# Patient Record
Sex: Female | Born: 2005
Health system: Southern US, Community
[De-identification: ages and names within clinical notes are randomized; demographics above are authoritative.]

## PROBLEM LIST (undated history)

## (undated) DIAGNOSIS — R002 Palpitations: Secondary | ICD-10-CM

## (undated) DIAGNOSIS — G43909 Migraine, unspecified, not intractable, without status migrainosus: Secondary | ICD-10-CM

## (undated) DIAGNOSIS — J45909 Unspecified asthma, uncomplicated: Secondary | ICD-10-CM

## (undated) HISTORY — DX: Migraine, unspecified, not intractable, without status migrainosus: G43.909

## (undated) HISTORY — DX: Unspecified asthma, uncomplicated: J45.909

## (undated) HISTORY — DX: Palpitations: R00.2

---
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
## 2023-05-03 ENCOUNTER — Telehealth: Payer: Self-pay | Admitting: Neurology

## 2023-05-03 NOTE — Telephone Encounter (Signed)
 Copied from CRM [id number]. Topic: Clinical - Request for Lab/Test Order >> May 03, 2023  3:41 PM Aletta Edouard wrote: Reason for CRM: patient is requesting a tb test

## 2023-05-04 NOTE — Telephone Encounter (Signed)
 Pt needs nurse visit please

## 2023-05-05 ENCOUNTER — Other Ambulatory Visit: Payer: Self-pay

## 2023-05-05 DIAGNOSIS — Z111 Encounter for screening for respiratory tuberculosis: Secondary | ICD-10-CM

## 2023-05-07 ENCOUNTER — Other Ambulatory Visit (INDEPENDENT_AMBULATORY_CARE_PROVIDER_SITE_OTHER)

## 2023-05-07 DIAGNOSIS — Z111 Encounter for screening for respiratory tuberculosis: Secondary | ICD-10-CM

## 2023-05-11 LAB — QUANTIFERON-TB GOLD PLUS
Mitogen-NIL: 8.32 [IU]/mL
NIL: 0.03 [IU]/mL
QuantiFERON-TB Gold Plus: NEGATIVE
TB1-NIL: 0 [IU]/mL
TB2-NIL: 0 [IU]/mL

## 2023-08-18 ENCOUNTER — Encounter: Payer: Self-pay | Admitting: Family Medicine
